# Patient Record
Sex: Male | Born: 1970 | Race: White | Hispanic: No | Marital: Married | Smoking: Never smoker
Health system: Southern US, Community
[De-identification: ages and names within clinical notes are randomized; demographics above are authoritative.]

## PROBLEM LIST (undated history)

## (undated) DIAGNOSIS — I10 Essential (primary) hypertension: Secondary | ICD-10-CM

## (undated) DIAGNOSIS — E119 Type 2 diabetes mellitus without complications: Secondary | ICD-10-CM

## (undated) HISTORY — DX: Essential (primary) hypertension: I10

## (undated) HISTORY — DX: Type 2 diabetes mellitus without complications: E11.9

---

## 2000-12-17 HISTORY — PX: ANKLE SURGERY: SHX546

## 2022-01-08 ENCOUNTER — Other Ambulatory Visit: Payer: Self-pay | Admitting: Internal Medicine

## 2022-01-08 DIAGNOSIS — I208 Other forms of angina pectoris: Secondary | ICD-10-CM

## 2022-01-24 ENCOUNTER — Other Ambulatory Visit: Payer: Self-pay

## 2022-01-24 ENCOUNTER — Ambulatory Visit
Admission: RE | Admit: 2022-01-24 | Discharge: 2022-01-24 | Disposition: A | Discharge status: Home / Self Care | Payer: Self-pay | Source: Ambulatory Visit | Attending: Internal Medicine | Admitting: Internal Medicine

## 2022-01-24 DIAGNOSIS — I208 Other forms of angina pectoris: Secondary | ICD-10-CM | POA: Insufficient documentation

## 2022-09-11 ENCOUNTER — Encounter
Admission: RE | Admit: 2022-09-11 | Discharge: 2022-09-11 | Disposition: A | Discharge status: Home / Self Care | Payer: Commercial Managed Care - PPO | Source: Ambulatory Visit | Attending: Podiatry | Admitting: Podiatry

## 2022-09-11 ENCOUNTER — Other Ambulatory Visit: Payer: Self-pay

## 2022-09-11 ENCOUNTER — Other Ambulatory Visit: Payer: Self-pay | Admitting: Podiatry

## 2022-09-11 DIAGNOSIS — Z01818 Encounter for other preprocedural examination: Secondary | ICD-10-CM

## 2022-09-11 NOTE — Patient Instructions (Addendum)
Your procedure is scheduled on: 9/29/ 2023 Report to the Registration Desk on the 1st floor of the [facility]. To find out your arrival time, please call [phone] between 1PM - 3PM on: 9/28 /2023  If your arrival time is 6:00 am, do not arrive prior to that time as the [facility] entrance doors do not open until 6:00 am.  REMEMBER: Instructions that are not followed completely may result in serious medical risk, up to and including death; or upon the discretion of your surgeon and anesthesiologist your surgery may need to be rescheduled.  Do not eat food after midnight the night before surgery.  No gum chewing, lozengers or hard candies.  You may however, drink water liquids up to 2 hours before you are scheduled to arrive for your surgery. Do not drink anything within 2 hours of your scheduled arrival time. Type 1 and Type 2 diabetics should only drink water.  In addition, your doctor has ordered for you to drink the provided  Gatorade G2- provided for you  Drinking this carbohydrate drink up to two hours before surgery helps to reduce insulin resistance and improve patient outcomes. Please complete drinking 2 hours prior to scheduled arrival time.     **Follow new guidelines for insulin and diabetes medications.**  Last dose of metformin  is 9/ 26 /2023. Hold metformin two days prior to surgery.   Follow recommendations from Cardiologist, Pulmonologist or PCP regarding stopping Aspirin, Coumadin, Plavix, Eliquis, Pradaxa, or Pletal.   - Per Dr. Vickki Muff keep taking aspirin until the day prior to surgery.  One week prior to surgery: Stop Anti-inflammatories (NSAIDS) such as Advil, Aleve, Ibuprofen, Motrin, Naproxen, Naprosyn and Aspirin based products such as Excedrin, Goodys Powder, BC Powder. Stop ANY OVER THE COUNTER supplements until after surgery. You may however, continue to take Tylenol if needed for pain up until the day of surgery.  No Alcohol for 24 hours before or  after surgery.  No Smoking including e-cigarettes for 24 hours prior to surgery.  No chewable tobacco products for at least 6 hours prior to surgery.  No nicotine patches on the day of surgery.  Do not use any "recreational" drugs for at least a week prior to your surgery.  Please be advised that the combination of cocaine and anesthesia may have negative outcomes, up to and including death. If you test positive for cocaine, your surgery will be cancelled.  On the morning of surgery brush your teeth with toothpaste and water, you may rinse your mouth with mouthwash if you wish. Do not swallow any toothpaste or mouthwash.  Use CHG Soap with shower as directed on instruction sheet.  Do not wear jewelry, make-up, hairpins, clips or nail polish.  Do not wear lotions, powders, or perfumes.   Do not shave body from the neck down 48 hours prior to surgery just in case you cut yourself which could leave a site for infection.  Also, freshly shaved skin may become irritated if using the CHG soap.  Contact lenses, hearing aids and dentures may not be worn into surgery.  Do not bring valuables to the hospital. [facility] is not responsible for any missing/lost belongings or valuables.    Notify your doctor if there is any change in your medical condition (cold, fever, infection).  Wear comfortable clothing (specific to your surgery type) to the hospital.  After surgery, you can help prevent lung complications by doing breathing exercises.  Take deep breaths and cough every 1-2  hours. Your doctor may order a device called an Incentive Spirometer to help you take deep breaths. If you are being admitted to the hospital overnight, leave your suitcase in the car. After surgery it may be brought to your room.  If you are being discharged the day of surgery, you will not be allowed to drive home. You will need a responsible adult (18 years or older) to drive you home and stay with you that night.    If you are taking public transportation, you will need to have a responsible adult (18 years or older) with you. Please confirm with your physician that it is acceptable to use public transportation.   Please call the Pre-admissions Testing Dept. at [phone] if you have any questions about these instructions.  Surgery Visitation Policy:  Patients undergoing a surgery or procedure may have two family members or support persons with them as long as the person is not COVID-19 positive or experiencing its symptoms.   Inpatient Visitation:    Visiting hours are 7 a.m. to 8 p.m. Up to four visitors are allowed at one time in a patient room, including children. The visitors may rotate out with other people during the day. One designated support person (adult) may remain overnight.

## 2022-09-12 ENCOUNTER — Encounter
Admission: RE | Admit: 2022-09-12 | Discharge: 2022-09-12 | Disposition: A | Discharge status: Home / Self Care | Payer: Commercial Managed Care - PPO | Source: Ambulatory Visit | Attending: Podiatry | Admitting: Podiatry

## 2022-09-12 DIAGNOSIS — Z01818 Encounter for other preprocedural examination: Secondary | ICD-10-CM

## 2022-09-13 MED ORDER — CHLORHEXIDINE GLUCONATE 0.12 % MT SOLN: 15.0000 mL | Freq: Once | OROMUCOSAL | Status: AC

## 2022-09-13 MED ORDER — FAMOTIDINE 20 MG PO TABS
20.0000 mg | ORAL_TABLET | Freq: Once | ORAL | Status: AC
  Administered 2022-09-14: 20 mg via ORAL

## 2022-09-13 MED ORDER — CEFAZOLIN SODIUM-DEXTROSE 2-4 GM/100ML-% IV SOLN
2.0000 g | INTRAVENOUS | Status: AC
  Administered 2022-09-14: 2 g via INTRAVENOUS

## 2022-09-13 MED ORDER — ORAL CARE MOUTH RINSE: 15.0000 mL | Freq: Once | OROMUCOSAL | Status: AC

## 2022-09-13 MED ORDER — SODIUM CHLORIDE 0.9 % IV SOLN: INTRAVENOUS | Status: DC

## 2022-09-14 ENCOUNTER — Encounter
Admission: RE | Disposition: A | Discharge status: Home / Self Care | Payer: Self-pay | Source: Ambulatory Visit | Attending: Podiatry

## 2022-09-14 ENCOUNTER — Other Ambulatory Visit: Payer: Self-pay

## 2022-09-14 ENCOUNTER — Encounter: Payer: Self-pay | Admitting: Podiatry

## 2022-09-14 ENCOUNTER — Ambulatory Visit
Admission: RE | Admit: 2022-09-14 | Discharge: 2022-09-14 | Disposition: A | Discharge status: Home / Self Care | Payer: Commercial Managed Care - PPO | Source: Ambulatory Visit | Attending: Podiatry | Admitting: Podiatry

## 2022-09-14 ENCOUNTER — Ambulatory Visit: Payer: Commercial Managed Care - PPO | Admitting: Certified Registered"

## 2022-09-14 DIAGNOSIS — E1142 Type 2 diabetes mellitus with diabetic polyneuropathy: Secondary | ICD-10-CM | POA: Diagnosis present

## 2022-09-14 DIAGNOSIS — E1152 Type 2 diabetes mellitus with diabetic peripheral angiopathy with gangrene: Secondary | ICD-10-CM | POA: Diagnosis not present

## 2022-09-14 DIAGNOSIS — M86172 Other acute osteomyelitis, left ankle and foot: Secondary | ICD-10-CM | POA: Insufficient documentation

## 2022-09-14 DIAGNOSIS — Z01818 Encounter for other preprocedural examination: Secondary | ICD-10-CM

## 2022-09-14 DIAGNOSIS — E11621 Type 2 diabetes mellitus with foot ulcer: Secondary | ICD-10-CM | POA: Diagnosis not present

## 2022-09-14 DIAGNOSIS — E1169 Type 2 diabetes mellitus with other specified complication: Secondary | ICD-10-CM | POA: Diagnosis not present

## 2022-09-14 DIAGNOSIS — L98496 Non-pressure chronic ulcer of skin of other sites with bone involvement without evidence of necrosis: Secondary | ICD-10-CM | POA: Insufficient documentation

## 2022-09-14 DIAGNOSIS — M86672 Other chronic osteomyelitis, left ankle and foot: Secondary | ICD-10-CM | POA: Diagnosis not present

## 2022-09-14 HISTORY — PX: AMPUTATION TOE: SHX6595

## 2022-09-14 LAB — GLUCOSE, CAPILLARY
Glucose-Capillary: 87 mg/dL (ref 70–99)
Glucose-Capillary: 134 mg/dL — ABNORMAL HIGH (ref 70–99)

## 2022-09-14 SURGERY — AMPUTATION, TOE
Anesthesia: General | Site: Toe | Laterality: Left

## 2022-09-14 MED ORDER — PROPOFOL 10 MG/ML IV BOLUS
INTRAVENOUS | Status: DC | PRN
  Administered 2022-09-14: 50 mg via INTRAVENOUS

## 2022-09-14 MED ORDER — FENTANYL CITRATE (PF) 100 MCG/2ML IJ SOLN
INTRAMUSCULAR | Status: AC
  Filled 2022-09-14: qty 2

## 2022-09-14 MED ORDER — PROPOFOL 500 MG/50ML IV EMUL
INTRAVENOUS | Status: DC | PRN
  Administered 2022-09-14: 50 ug/kg/min via INTRAVENOUS

## 2022-09-14 MED ORDER — FENTANYL CITRATE (PF) 100 MCG/2ML IJ SOLN: 25.0000 ug | INTRAMUSCULAR | Status: DC | PRN

## 2022-09-14 MED ORDER — PROPOFOL 1000 MG/100ML IV EMUL
INTRAVENOUS | Status: AC
  Filled 2022-09-14: qty 100

## 2022-09-14 MED ORDER — 0.9 % SODIUM CHLORIDE (POUR BTL) OPTIME
TOPICAL | Status: DC | PRN
  Administered 2022-09-14: 500 mL

## 2022-09-14 MED ORDER — BUPIVACAINE HCL (PF) 0.5 % IJ SOLN
INTRAMUSCULAR | Status: AC
  Filled 2022-09-14: qty 30

## 2022-09-14 MED ORDER — BUPIVACAINE HCL 0.5 % IJ SOLN
INTRAMUSCULAR | Status: DC | PRN
  Administered 2022-09-14: 5 mL

## 2022-09-14 MED ORDER — MIDAZOLAM HCL 5 MG/5ML IJ SOLN
INTRAMUSCULAR | Status: DC | PRN
  Administered 2022-09-14: 2 mg via INTRAVENOUS

## 2022-09-14 MED ORDER — LIDOCAINE 2% (20 MG/ML) 5 ML SYRINGE
INTRAMUSCULAR | Status: DC | PRN
  Administered 2022-09-14: 20 mg via INTRAVENOUS

## 2022-09-14 MED ORDER — FENTANYL CITRATE (PF) 100 MCG/2ML IJ SOLN
INTRAMUSCULAR | Status: DC | PRN
  Administered 2022-09-14 (×2): 50 ug via INTRAVENOUS

## 2022-09-14 MED ORDER — GLYCOPYRROLATE 0.2 MG/ML IJ SOLN
INTRAMUSCULAR | Status: DC | PRN
  Administered 2022-09-14: .2 mg via INTRAVENOUS

## 2022-09-14 MED ORDER — PROPOFOL 10 MG/ML IV BOLUS
INTRAVENOUS | Status: AC
  Filled 2022-09-14: qty 20

## 2022-09-14 MED ORDER — FAMOTIDINE 20 MG PO TABS
ORAL_TABLET | ORAL | Status: AC
  Filled 2022-09-14: qty 1

## 2022-09-14 MED ORDER — ONDANSETRON HCL 4 MG/2ML IJ SOLN: 4.0000 mg | Freq: Once | INTRAMUSCULAR | Status: DC | PRN

## 2022-09-14 MED ORDER — CHLORHEXIDINE GLUCONATE 0.12 % MT SOLN
OROMUCOSAL | Status: AC
  Administered 2022-09-14: 15 mL via OROMUCOSAL
  Filled 2022-09-14: qty 15

## 2022-09-14 MED ORDER — MIDAZOLAM HCL 2 MG/2ML IJ SOLN
INTRAMUSCULAR | Status: AC
  Filled 2022-09-14: qty 2

## 2022-09-14 MED ORDER — CEFAZOLIN SODIUM-DEXTROSE 2-4 GM/100ML-% IV SOLN
INTRAVENOUS | Status: AC
  Filled 2022-09-14: qty 100

## 2022-09-14 SURGICAL SUPPLY — 27 items
BLADE OSC/SAGITTAL MD 5.5X18 (BLADE) ×1 IMPLANT
BNDG CMPR 75X21 PLY HI ABS (MISCELLANEOUS) ×1
BNDG CMPR STD VLCR NS LF 5.8X4 (GAUZE/BANDAGES/DRESSINGS) ×1
BNDG ELASTIC 4X5.8 VLCR NS LF (GAUZE/BANDAGES/DRESSINGS) ×1 IMPLANT
DURAPREP 26ML APPLICATOR (WOUND CARE) ×1 IMPLANT
ELECT REM PT RETURN 9FT ADLT (ELECTROSURGICAL) ×1
ELECTRODE REM PT RTRN 9FT ADLT (ELECTROSURGICAL) ×1 IMPLANT
GAUZE SPONGE 4X4 12PLY STRL (GAUZE/BANDAGES/DRESSINGS) ×1 IMPLANT
GAUZE STRETCH 2X75IN STRL (MISCELLANEOUS) ×1 IMPLANT
GAUZE XEROFORM 1X8 LF (GAUZE/BANDAGES/DRESSINGS) ×1 IMPLANT
GLOVE BIO SURGEON STRL SZ7.5 (GLOVE) ×1 IMPLANT
GLOVE SURG UNDER LTX SZ8 (GLOVE) ×1 IMPLANT
GOWN STRL REUS W/ TWL XL LVL3 (GOWN DISPOSABLE) ×2 IMPLANT
GOWN STRL REUS W/TWL XL LVL3 (GOWN DISPOSABLE) ×2
KIT TURNOVER KIT A (KITS) ×1 IMPLANT
MANIFOLD NEPTUNE II (INSTRUMENTS) ×1 IMPLANT
NDL FILTER BLUNT 18X1 1/2 (NEEDLE) ×1 IMPLANT
NDL HYPO 25X1 1.5 SAFETY (NEEDLE) ×1 IMPLANT
NEEDLE FILTER BLUNT 18X1 1/2 (NEEDLE) ×1 IMPLANT
NEEDLE HYPO 25X1 1.5 SAFETY (NEEDLE) ×1 IMPLANT
NS IRRIG 500ML POUR BTL (IV SOLUTION) ×1 IMPLANT
PACK EXTREMITY ARMC (MISCELLANEOUS) ×1 IMPLANT
STOCKINETTE M/LG 89821 (MISCELLANEOUS) ×1 IMPLANT
SUT ETHILON 3-0 FS-10 30 BLK (SUTURE) ×1
SUTURE EHLN 3-0 FS-10 30 BLK (SUTURE) ×1 IMPLANT
SYR 10ML LL (SYRINGE) ×3 IMPLANT
TRAP FLUID SMOKE EVACUATOR (MISCELLANEOUS) ×1 IMPLANT

## 2022-09-14 NOTE — Op Note (Signed)
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  Local consisted of a total of 5 cc of 0.5% bupivacaine    Hemostasis: Ankle tourniquet inflated to 200 mmHg for 3 minutes    Specimen: Toe amputation for pathology and wound culture    Complications: None    Operative indications:Jose Norman is an 51 y.o. that presents today for surgical intervention.  The risks/benefits/alternatives/complications have been discussed and consent has been given.    Procedure:  Patient was brought into the OR and placed on the operating table in thesupine position. After anesthesia was obtained theleft lower extremity was prepped and draped in usual sterile fashion.  Attention was directed to the left toe where distal to the metatarsophalangeal joint 2 full-thickness flaps were created.  This was at the level of the midshaft of the proximal phalanx.  Dissection was carried down to the bone.  Next a osteotomy was created with a power saw.  The distal portion of the toe was then removed from the surgical field.  The wound was flushed with copious amounts of irrigation.  The tourniquet was dropped.  All bleeders were Bovie cauterized.  Closure was then performed with 3-0 nylon.    Patient tolerated the procedure and anesthesia well.  Was transported from the OR to the PACU with all vital signs stable and vascular status intact. To be discharged per routine protocol.  Will follow up in approximately 1 week in the outpatient clinic.

## 2022-09-14 NOTE — Anesthesia Preprocedure Evaluation (Signed)
Anesthesia Evaluation  Patient identified by MRN, date of birth, ID band Patient awake    Reviewed: Allergy & Precautions, H&P , NPO status , Patient's Chart, lab work & pertinent test results, reviewed documented beta blocker date and time   Airway Mallampati: II  TM Distance: >3 FB Neck ROM: full    Dental  (+) Teeth Intact   Pulmonary neg pulmonary ROS,    Pulmonary exam normal        Cardiovascular Exercise Tolerance: Good hypertension, On Medications negative cardio ROS Normal cardiovascular exam Rate:Normal     Neuro/Psych negative neurological ROS  negative psych ROS   GI/Hepatic negative GI ROS, Neg liver ROS,   Endo/Other  negative endocrine ROSdiabetes, Well Controlled, Type 2, Oral Hypoglycemic Agents  Renal/GU negative Renal ROS  negative genitourinary   Musculoskeletal   Abdominal   Peds  Hematology negative hematology ROS (+)   Anesthesia Other Findings   Reproductive/Obstetrics negative OB ROS                             Anesthesia Physical Anesthesia Plan  ASA: 2  Anesthesia Plan: MAC   Post-op Pain Management:    Induction:   PONV Risk Score and Plan:   Airway Management Planned:   Additional Equipment:   Intra-op Plan:   Post-operative Plan:   Informed Consent: I have reviewed the patients History and Physical, chart, labs and discussed the procedure including the risks, benefits and alternatives for the proposed anesthesia with the patient or authorized representative who has indicated his/her understanding and acceptance.       Plan Discussed with: CRNA  Anesthesia Plan Comments:         Anesthesia Quick Evaluation

## 2022-09-14 NOTE — Transfer of Care (Signed)
Immediate Anesthesia Transfer of Care Note  Patient: Jose Norman  Procedure(s) Performed: AMPUTATION TOE (Left: Toe)  Patient Location: PACU  Anesthesia Type:General  Level of Consciousness: awake  Airway & Oxygen Therapy: Patient Spontanous Breathing  Post-op Assessment: Report given to RN and Post -op Vital signs reviewed and stable  Post vital signs: Reviewed  Last Vitals:  Vitals Value Taken Time  BP    Temp    Pulse    Resp    SpO2      Last Pain:         Complications: No notable events documented.

## 2022-09-14 NOTE — Discharge Instructions (Addendum)
[facility]  POST OPERATIVE INSTRUCTIONS FOR DR. FOWLER AND [facility] PODIATRY DEPARTMENT   Take your medication as prescribed.  Pain medication should be taken only as needed.  Keep the dressing clean, dry and intact.  Keep your foot elevated above the heart level for the first 48 hours.  Walking to the bathroom and brief periods of walking are acceptable, unless we have instructed you to be non-weight bearing.  Always wear your post-op shoe when walking.  Always use your crutches if you are to be non-weight bearing.  Do not take a shower. Baths are permissible as long as the foot is kept out of the water.   Every hour you are awake:  Bend your knee 15 times. Flex foot 15 times Massage calf 15 times  Call [facility] ([phone]) if any of the following problems occur: You develop a temperature or fever. The bandage becomes saturated with blood. Medication does not stop your pain. Injury of the foot occurs. Any symptoms of infection including redness, odor, or red streaks running from wound.    AMBULATORY SURGERY  DISCHARGE INSTRUCTIONS   The drugs that you were given will stay in your system until tomorrow so for the next 24 hours you should not:  Drive an automobile Make any legal decisions Drink any alcoholic beverage   You may resume regular meals tomorrow.  Today it is better to start with liquids and gradually work up to solid foods.  You may eat anything you prefer, but it is better to start with liquids, then soup and crackers, and gradually work up to solid foods.   Please notify your doctor immediately if you have any unusual bleeding, trouble breathing, redness and pain at the surgery site, drainage, fever, or pain not relieved by medication.    Additional Instructions:        Please contact your physician with any problems or Same Day Surgery at [phone], Monday through  Friday 6 am to 4 pm, or [facility] number at [phone]. 

## 2022-09-14 NOTE — H&P (Signed)
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  The various methods of treatment have been discussed with the patient.  No guarantees were given.  After consideration of risks, benefits and other options for treatment, the patient has consented to surgery.  I have reviewed the patients' chart and labs.     A history and physical examination was performed in my office.  The patient was reexamined.  There have been no changes to this history and physical examination.  Samara Deist A

## 2022-09-18 LAB — SURGICAL PATHOLOGY

## 2022-09-18 NOTE — Anesthesia Postprocedure Evaluation (Signed)
Anesthesia Post Note  Patient: Jose Norman  Procedure(s) Performed: AMPUTATION TOE (Left: Toe)  Patient location during evaluation: PACU Anesthesia Type: General Level of consciousness: awake and alert Pain management: pain level controlled Vital Signs Assessment: post-procedure vital signs reviewed and stable Respiratory status: spontaneous breathing, nonlabored ventilation, respiratory function stable and patient connected to nasal cannula oxygen Cardiovascular status: blood pressure returned to baseline and stable Postop Assessment: no apparent nausea or vomiting Anesthetic complications: no   No notable events documented.   Last Vitals:  Vitals:   09/14/22 1100 09/14/22 1107  BP: 112/76 112/76  Pulse: (!) 55 (!) 55  Resp: 14 14  Temp:  (!) 36.1 C  SpO2: 99% 100%    Last Pain:  Vitals:   09/15/22 1658  TempSrc:   PainSc: 2                  Molli Barrows

## 2022-09-22 LAB — AEROBIC/ANAEROBIC CULTURE W GRAM STAIN (SURGICAL/DEEP WOUND)
Culture: NO GROWTH
Gram Stain: NONE SEEN

## 2022-10-24 ENCOUNTER — Other Ambulatory Visit: Payer: Self-pay | Admitting: Physician Assistant

## 2022-10-24 DIAGNOSIS — M7501 Adhesive capsulitis of right shoulder: Secondary | ICD-10-CM

## 2022-10-31 ENCOUNTER — Ambulatory Visit
Admission: RE | Admit: 2022-10-31 | Discharge: 2022-10-31 | Disposition: A | Discharge status: Home / Self Care | Payer: Commercial Managed Care - PPO | Source: Ambulatory Visit | Attending: Physician Assistant | Admitting: Physician Assistant

## 2022-10-31 DIAGNOSIS — M7501 Adhesive capsulitis of right shoulder: Secondary | ICD-10-CM

## 2023-01-17 IMAGING — CT CT CARDIAC CORONARY ARTERY CALCIUM SCORE
3 series · 14 of 20 positions shown, 16 images · non-contrast
Comparison: None.
COMPARISON: None.

Addendum:
EXAM:
OVER-READ INTERPRETATION  CT CHEST

The following report is an over-read performed by radiologist Dr.
Kimhung Fan [REDACTED] on 01/24/2022. This
over-read does not include interpretation of cardiac or coronary
anatomy or pathology. The coronary calcium score interpretation by
the cardiologist is attached.
CLINICAL DATA: Risk stratification
Coronary Calcium Score
TECHNIQUE: The patient was scanned on a Siemens Somatom go.Top Scanner. Axial
non-contrast 3 mm slices were carried out through the heart. The
data set was analyzed on a dedicated work station and scored using
the Agatson method.

[Series 2: sa36 calcium scoring 3.00 · axial · 0.38mm/px · z∈[-1112,-1031]mm · 4 of 46 slices shown]
[im 10/46  vessel]
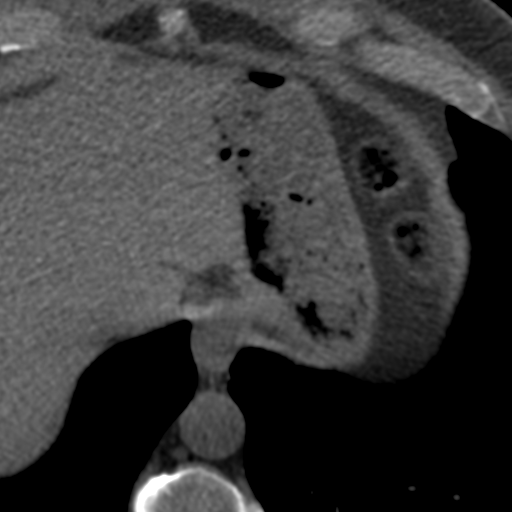
[im 19/46  vessel]
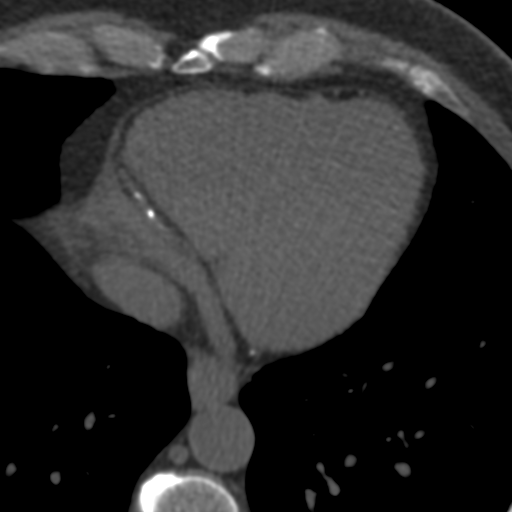
[im 28/46  vessel]
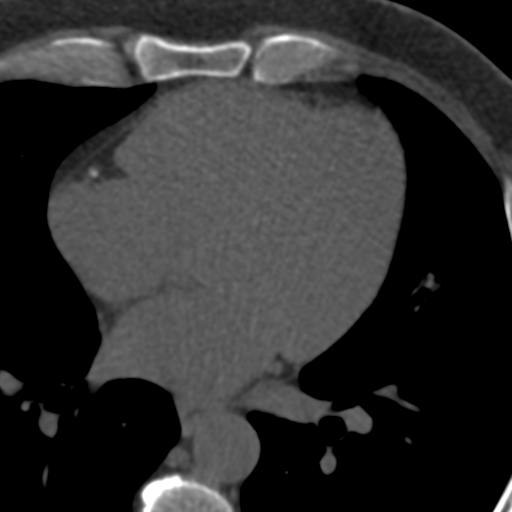
[im 37/46  vessel]
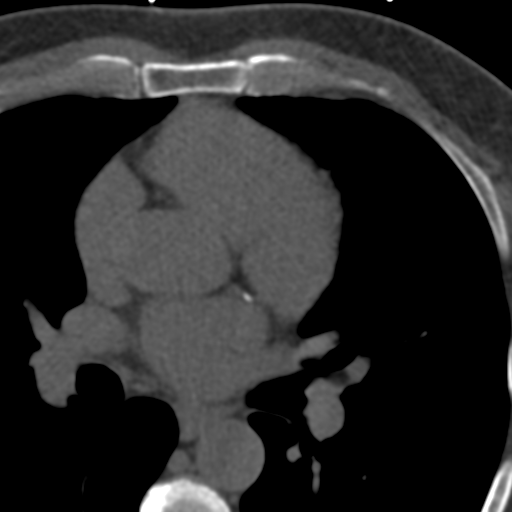

[Series 5: full fov st calcium scoring 3.00 · axial · 0.69mm/px · z∈[-1118,-1028]mm · 5 of 46 slices shown, 7 images]
[im 8/46  vessel]
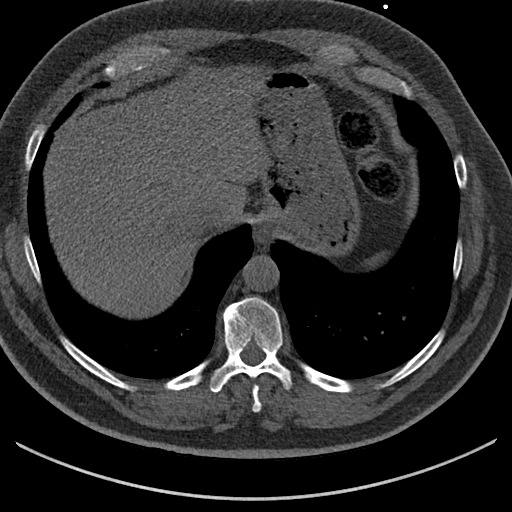
[im 8/46  lung]
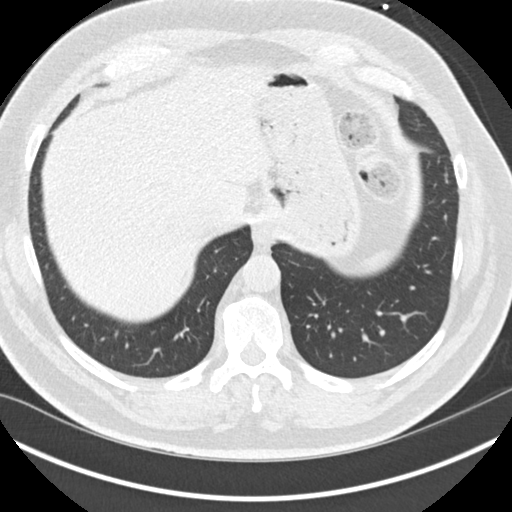
[im 16/46  vessel]
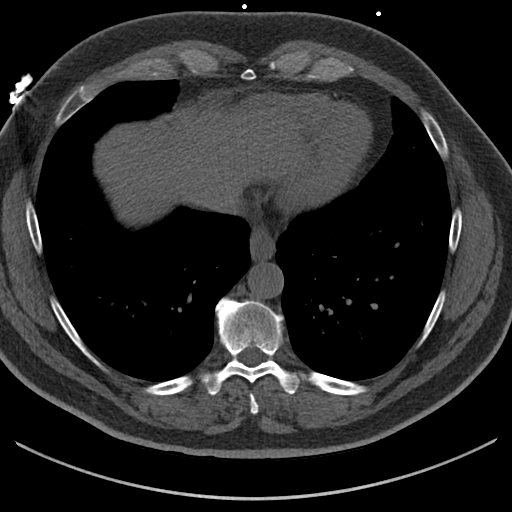
[im 23/46  vessel]
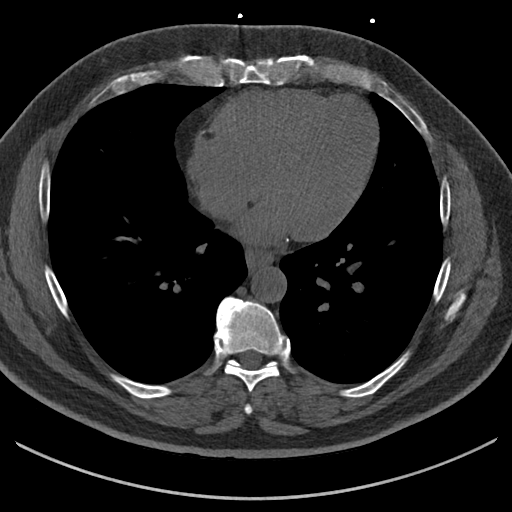
[im 31/46  vessel]
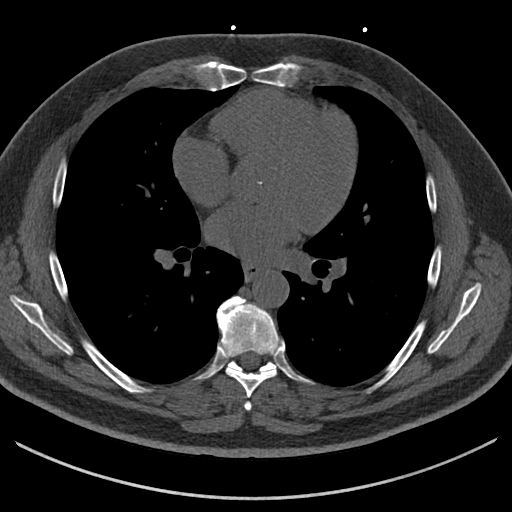
[im 38/46  vessel]
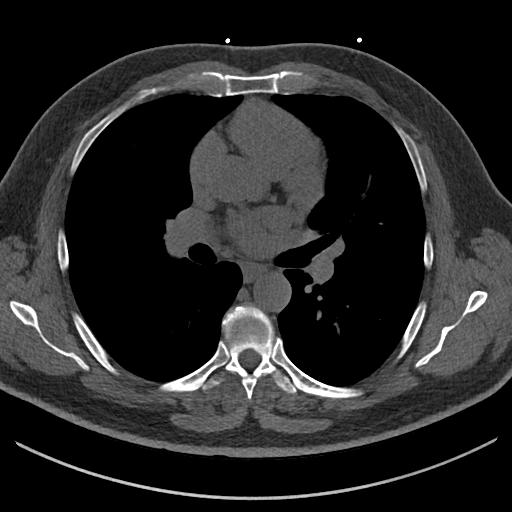
[im 38/46  lung]
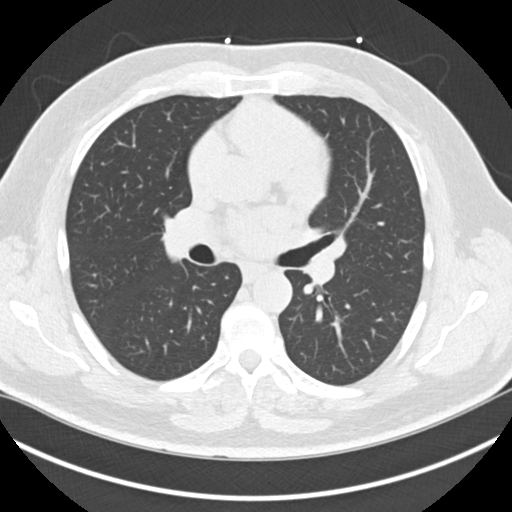

[Series 10: full fov lungs calcium scoring 3.00 ax · axial · 0.69mm/px · z∈[-1118,-1028]mm · 5 of 46 slices shown]
[im 8/46  vessel]
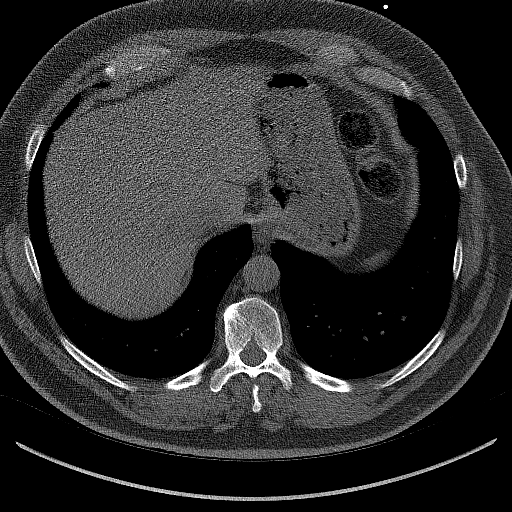
[im 16/46  vessel]
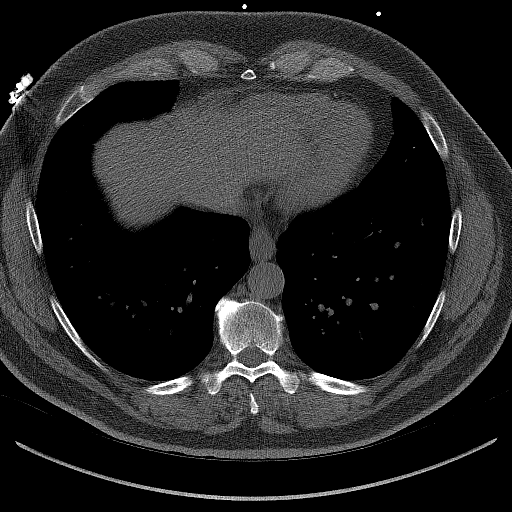
[im 23/46  vessel]
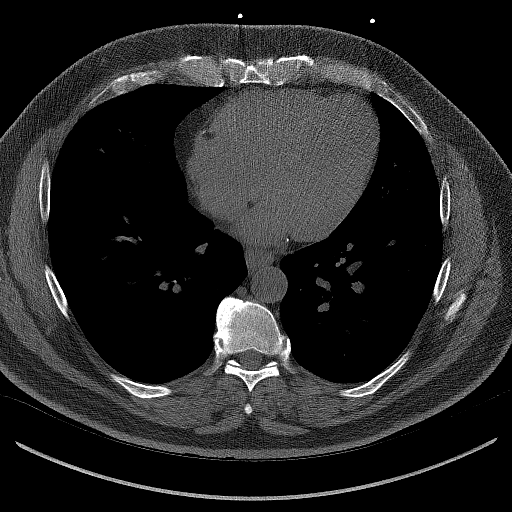
[im 31/46  vessel]
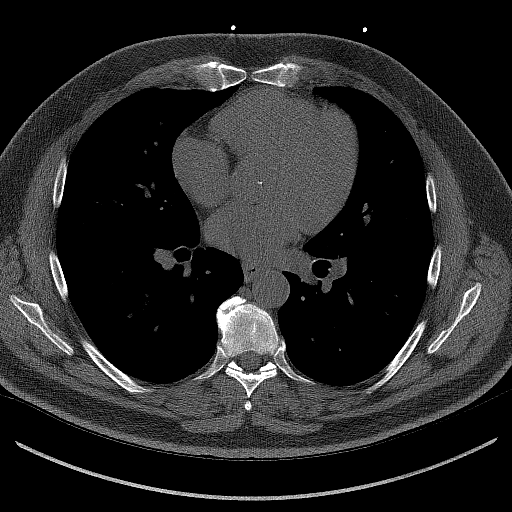
[im 38/46  vessel]
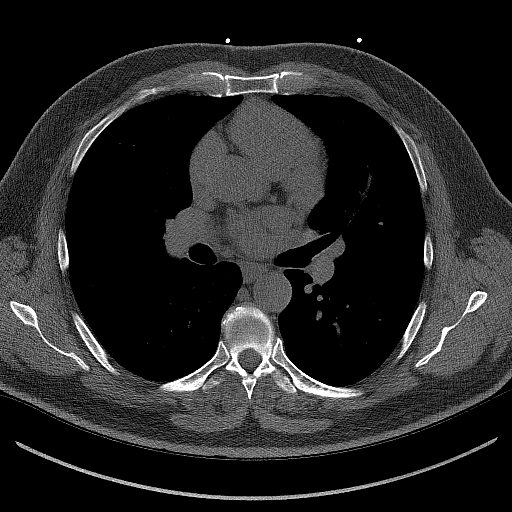

[14 of 20 positions shown; findings below may reference images not displayed]

FINDINGS: Tiny calcified granuloma in the right lower lobe. Within the
visualized portions of the thorax there are no suspicious appearing
pulmonary nodules or masses, there is no acute consolidative
airspace disease, no pleural effusions, no pneumothorax and no
lymphadenopathy. Visualized portions of the upper abdomen are
unremarkable. There are no aggressive appearing lytic or blastic
lesions noted in the visualized portions of the skeleton.
IMPRESSION: 1. No significant incidental noncardiac findings are noted.
FINDINGS: Non-cardiac: See separate report from [REDACTED].

Ascending Aorta: Normal size

Pericardium: Normal

Coronary arteries: Normal origin of left and right coronary
arteries. Distribution of arterial calcifications if present, as
noted below;

LM 0

LAD 237

LCx 30

RCA 273

Total 540

IMPRESSION AND RECOMMENDATION:
1. Coronary calcium score of 540. This was 98th percentile for age
and sex matched control.

2. CAC >300 in LAD, LCx, RCA. ABSEY JOUDA3/N3.

3. Recommend aspirin and statin if no contraindication.

4. Continue heart healthy lifestyle and risk factor modification.

5. Consider cardiology consultation.

Raynard Armijos

*** End of Addendum ***
EXAM:
OVER-READ INTERPRETATION  CT CHEST

The following report is an over-read performed by radiologist Dr.
Kimhung Fan [REDACTED] on 01/24/2022. This
over-read does not include interpretation of cardiac or coronary
anatomy or pathology. The coronary calcium score interpretation by
the cardiologist is attached.
FINDINGS: Tiny calcified granuloma in the right lower lobe. Within the
visualized portions of the thorax there are no suspicious appearing
pulmonary nodules or masses, there is no acute consolidative
airspace disease, no pleural effusions, no pneumothorax and no
lymphadenopathy. Visualized portions of the upper abdomen are
unremarkable. There are no aggressive appearing lytic or blastic
lesions noted in the visualized portions of the skeleton.
IMPRESSION: 1. No significant incidental noncardiac findings are noted.

## 2023-04-12 ENCOUNTER — Ambulatory Visit: Payer: Commercial Managed Care - PPO

## 2023-04-12 DIAGNOSIS — Z800 Family history of malignant neoplasm of digestive organs: Secondary | ICD-10-CM | POA: Diagnosis not present

## 2023-04-12 DIAGNOSIS — Z1211 Encounter for screening for malignant neoplasm of colon: Secondary | ICD-10-CM | POA: Diagnosis present

## 2023-04-12 DIAGNOSIS — K640 First degree hemorrhoids: Secondary | ICD-10-CM | POA: Diagnosis not present

## 2023-06-29 ENCOUNTER — Other Ambulatory Visit: Payer: Self-pay

## 2023-06-29 ENCOUNTER — Emergency Department
Admission: EM | Admit: 2023-06-29 | Discharge: 2023-06-30 | Disposition: A | Discharge status: Home / Self Care | Payer: Commercial Managed Care - PPO | Attending: Emergency Medicine | Admitting: Emergency Medicine

## 2023-06-29 ENCOUNTER — Encounter: Payer: Self-pay | Admitting: Emergency Medicine

## 2023-06-29 ENCOUNTER — Emergency Department: Payer: Commercial Managed Care - PPO

## 2023-06-29 DIAGNOSIS — L03115 Cellulitis of right lower limb: Secondary | ICD-10-CM | POA: Insufficient documentation

## 2023-06-29 DIAGNOSIS — M7989 Other specified soft tissue disorders: Secondary | ICD-10-CM | POA: Diagnosis present

## 2023-06-29 LAB — COMPREHENSIVE METABOLIC PANEL
ALT: 20 U/L (ref 0–44)
AST: 22 U/L (ref 15–41)
Albumin: 4.6 g/dL (ref 3.5–5.0)
Alkaline Phosphatase: 60 U/L (ref 38–126)
Anion gap: 8 (ref 5–15)
BUN: 21 mg/dL — ABNORMAL HIGH (ref 6–20)
CO2: 24 mmol/L (ref 22–32)
Calcium: 8.8 mg/dL — ABNORMAL LOW (ref 8.9–10.3)
Chloride: 104 mmol/L (ref 98–111)
Creatinine, Ser: 0.94 mg/dL (ref 0.61–1.24)
GFR, Estimated: >60 mL/min (ref >60)
Glucose, Bld: 191 mg/dL — ABNORMAL HIGH (ref 70–99)
Potassium: 3.8 mmol/L (ref 3.5–5.1)
Sodium: 136 mmol/L (ref 135–145)
Total Bilirubin: 0.8 mg/dL (ref 0.3–1.2)
Total Protein: 7.5 g/dL (ref 6.5–8.1)

## 2023-06-29 LAB — CBC WITH DIFFERENTIAL/PLATELET
Abs Immature Granulocytes: 0.05 10*3/uL (ref 0.00–0.07)
Basophils Absolute: 0.1 10*3/uL (ref 0.0–0.1)
Basophils Relative: 1 %
Eosinophils Absolute: 0.2 10*3/uL (ref 0.0–0.5)
Eosinophils Relative: 2 %
HCT: 42.4 % (ref 39.0–52.0)
Hemoglobin: 14 g/dL (ref 13.0–17.0)
Immature Granulocytes: 1 %
Lymphocytes Relative: 19 %
Lymphs Abs: 1.9 10*3/uL (ref 0.7–4.0)
MCH: 29.5 pg (ref 26.0–34.0)
MCHC: 33 g/dL (ref 30.0–36.0)
MCV: 89.5 fL (ref 80.0–100.0)
Monocytes Absolute: 0.6 10*3/uL (ref 0.1–1.0)
Monocytes Relative: 7 %
Neutro Abs: 6.9 10*3/uL (ref 1.7–7.7)
Neutrophils Relative %: 70 %
Platelets: 286 10*3/uL (ref 150–400)
RBC: 4.74 MIL/uL (ref 4.22–5.81)
RDW: 12 % (ref 11.5–15.5)
WBC: 9.6 10*3/uL (ref 4.0–10.5)
nRBC: 0 % (ref 0.0–0.2)

## 2023-06-29 NOTE — ED Provider Notes (Signed)
[facility] Provider Note    Event Date/Time   First MD Initiated Contact with Patient 06/29/23 2309     (approximate)   History   Joint Swelling   HPI  Jose Norman is a 52 y.o. male  who presents to the emergency department today because of concern for right lower leg swelling. Noticed today. Has been associated with some change in sensation but denies any pain. Did suffer a cut to the bottom of his right great toe recently at the beach but feels like it has been getting better. No significant trauma to the foot or ankle. Has been able to ambulate on it. No fevers.    Physical Exam   Triage Vital Signs: ED Triage Vitals  Encounter Vitals Group     BP 06/29/23 1913 104/89     Systolic BP Percentile --      Diastolic BP Percentile --      Pulse Rate 06/29/23 1913 76     Resp 06/29/23 1913 18     Temp 06/29/23 1913 98.2 F (36.8 C)     Temp Source 06/29/23 1913 Oral     SpO2 06/29/23 1913 99 %     Weight 06/29/23 1914 242 lb (109.8 kg)     Height 06/29/23 1914 6' (1.829 m)     Head Circumference --      Peak Flow --      Pain Score 06/29/23 1914 2     Pain Loc --      Pain Education --      Exclude from Growth Chart --     Most recent vital signs: Vitals:   06/29/23 1913 06/29/23 2300  BP: 104/89 131/73  Pulse: 76 (!) 45  Resp: 18   Temp: 98.2 F (36.8 C)   SpO2: 99% 100%   General: Awake, alert, oriented. CV:  Good peripheral perfusion. Regular rate and rhythm. Resp:  Normal effort. Lungs clear. Abd:  No distention.  Other:  Swelling to right ankle, erythema to medial aspect of ankle and distal lower leg. No pain with movement of right ankle.   ED Results / Procedures / Treatments   Labs (all labs ordered are listed, but only abnormal results are displayed) Labs Reviewed  COMPREHENSIVE METABOLIC PANEL - Abnormal; Notable for the following components:      Result Value   Glucose, Bld 191 (*)    BUN 21 (*)    Calcium 8.8  (*)    All other components within normal limits  CBC WITH DIFFERENTIAL/PLATELET     EKG  Non3   RADIOLOGY I independently interpreted and visualized the right foot. My interpretation: No acute fracture Radiology interpretation:  IMPRESSION:  Possible avulsion along the dorsal aspect of the distal talus.    I independently interpreted and visualized the right ankle. My interpretation: No acute fracture Radiology interpretation:  IMPRESSION:  Soft tissue swelling without acute bony abnormality.    Lower extremity US: IMPRESSION:  Negative.    PROCEDURES:  Critical Care performed: No    MEDICATIONS ORDERED IN ED: Medications - No data to display   IMPRESSION / MDM / ASSESSMENT AND PLAN / ED COURSE  I reviewed the triage vital signs and the nursing notes.                              Differential diagnosis includes, but is not limited to, DVT, cellulitis, fracture, joint  infection  Patient's presentation is most consistent with acute presentation with potential threat to life or bodily function.   Patient presented to the emergency department today with concern for right ankle swelling and erythema. On exam ankle is swollen with erythema to medial aspect. At this time have low suspicion for joint infection given painless ROM. X-ray questioned avulsion fracture, however patient non tender over that area and did not have any significant history. Do think likely cellulitis given presentation, however given recent travel to beach will obtain [place] to evaluate for blood clot.  [place] negative for clot. Will plan on treating for cellulitis at this time.   FINAL CLINICAL IMPRESSION(S) / ED DIAGNOSES   Final diagnoses:  Cellulitis of right lower extremity      Note:  This document was prepared using Dragon voice recognition software and may include unintentional dictation errors.    Phineas Semen, MD 06/30/23 [phone]

## 2023-06-29 NOTE — ED Triage Notes (Signed)
Patient c/o right ankle and foot swelling and redness.  Patient reports he just noticed it today.  Patient sustained a cut to his great right toe recently at the beach but reports "it's healing."  Cut appears to be in a stage of healing.

## 2023-06-30 ENCOUNTER — Emergency Department: Payer: Commercial Managed Care - PPO

## 2023-06-30 MED ORDER — DOXYCYCLINE HYCLATE 100 MG PO CAPS: 100.0000 mg | ORAL_CAPSULE | Freq: Two times a day (BID) | ORAL | 0 refills | Status: AC

## 2023-06-30 NOTE — Discharge Instructions (Signed)
Please seek medical attention for any high fevers, chest pain, shortness of breath, change in behavior, persistent vomiting, bloody stool or any other new or concerning symptoms.  

## 2023-06-30 NOTE — ED Notes (Signed)
Report received by previous RN.   Pt c/o right lower leg swelling. Has redness, warm to touch, and +1 pitting edema. Also c/o right big toe sore present since 6/29. First noticed after a pool day. Denies pain. No fever/chills. Hx DM with right second toe amputation. Wife at bedside. Call bell within reach. Pt remains connected to VS monitor.

## 2023-06-30 NOTE — ED Notes (Signed)
Discharge instructions provided by Dr.Goodman were discussed with pt and wife at bedside. Pt verbalized understanding of instructions with no additional questions at this time. Verified pharmacy. Pt provided work note provided by edp. Pt ambulatory at discharge.

## 2023-08-27 ENCOUNTER — Ambulatory Visit
Admission: RE | Admit: 2023-08-27 | Discharge: 2023-08-27 | Disposition: A | Discharge status: Home / Self Care | Payer: Commercial Managed Care - PPO | Source: Ambulatory Visit | Attending: Endocrinology | Admitting: Endocrinology

## 2023-08-27 ENCOUNTER — Other Ambulatory Visit: Payer: Self-pay | Admitting: Endocrinology

## 2023-08-27 DIAGNOSIS — L97522 Non-pressure chronic ulcer of other part of left foot with fat layer exposed: Secondary | ICD-10-CM

## 2023-09-24 ENCOUNTER — Other Ambulatory Visit: Payer: Self-pay | Admitting: Podiatry

## 2023-09-24 DIAGNOSIS — L97522 Non-pressure chronic ulcer of other part of left foot with fat layer exposed: Secondary | ICD-10-CM

## 2023-09-24 DIAGNOSIS — R600 Localized edema: Secondary | ICD-10-CM

## 2023-10-10 ENCOUNTER — Other Ambulatory Visit: Payer: Commercial Managed Care - PPO
